# Patient Record
Sex: Female | Born: 1995 | Race: White | Hispanic: No | Marital: Single | Smoking: Never smoker
Health system: Southern US, Community
[De-identification: ages and names within clinical notes are randomized; demographics above are authoritative.]

## PROBLEM LIST (undated history)

## (undated) DIAGNOSIS — J4521 Mild intermittent asthma with (acute) exacerbation: Secondary | ICD-10-CM

## (undated) DIAGNOSIS — R0600 Dyspnea, unspecified: Secondary | ICD-10-CM

## (undated) DIAGNOSIS — R55 Syncope and collapse: Secondary | ICD-10-CM

## (undated) HISTORY — DX: Syncope and collapse: R55

## (undated) HISTORY — DX: Dyspnea, unspecified: R06.00

## (undated) HISTORY — DX: Mild intermittent asthma with (acute) exacerbation: J45.21

---
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
## 2016-01-12 ENCOUNTER — Other Ambulatory Visit: Payer: Self-pay | Admitting: Internal Medicine

## 2016-09-19 IMAGING — DX DG CHEST 2V
2 series · 2 of 2 positions shown · non-contrast
Comparison: None

CLINICAL DATA: Shortness of breath, syncope after running

EXAM:
CHEST  2 VIEW

[chest pa]
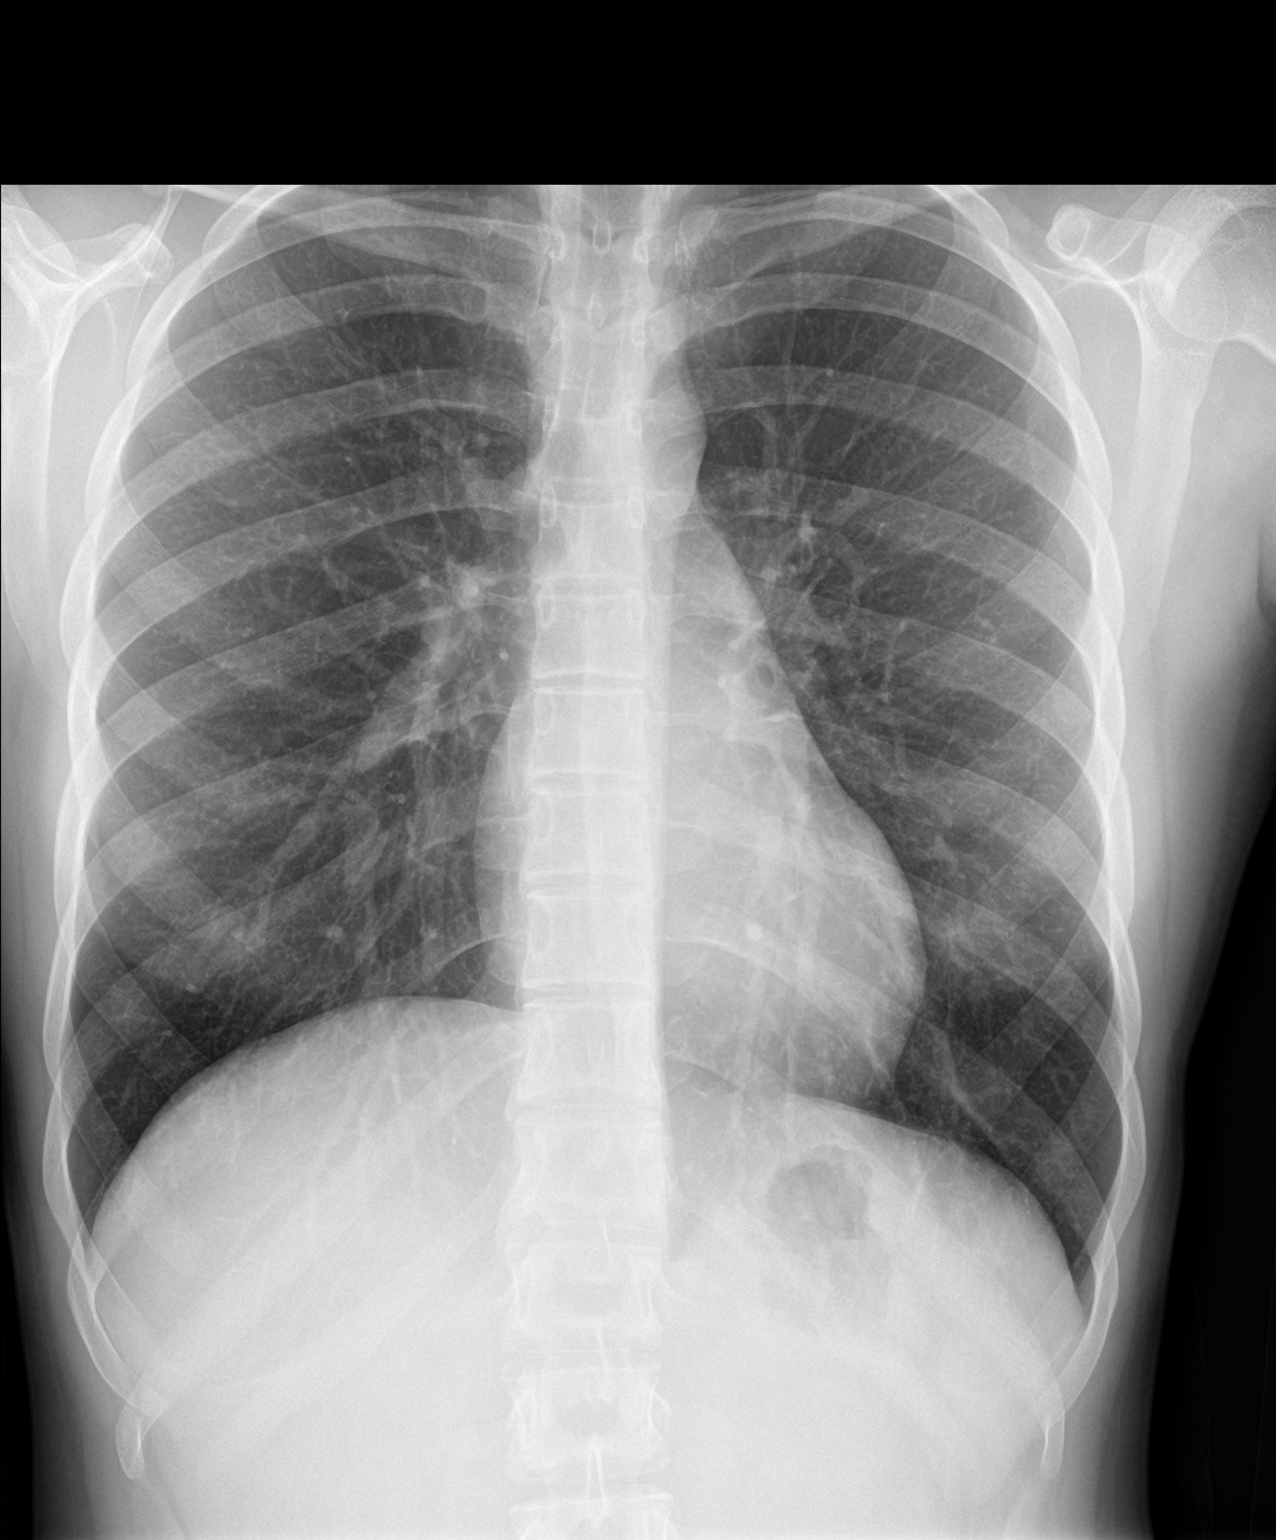

[chest lat]
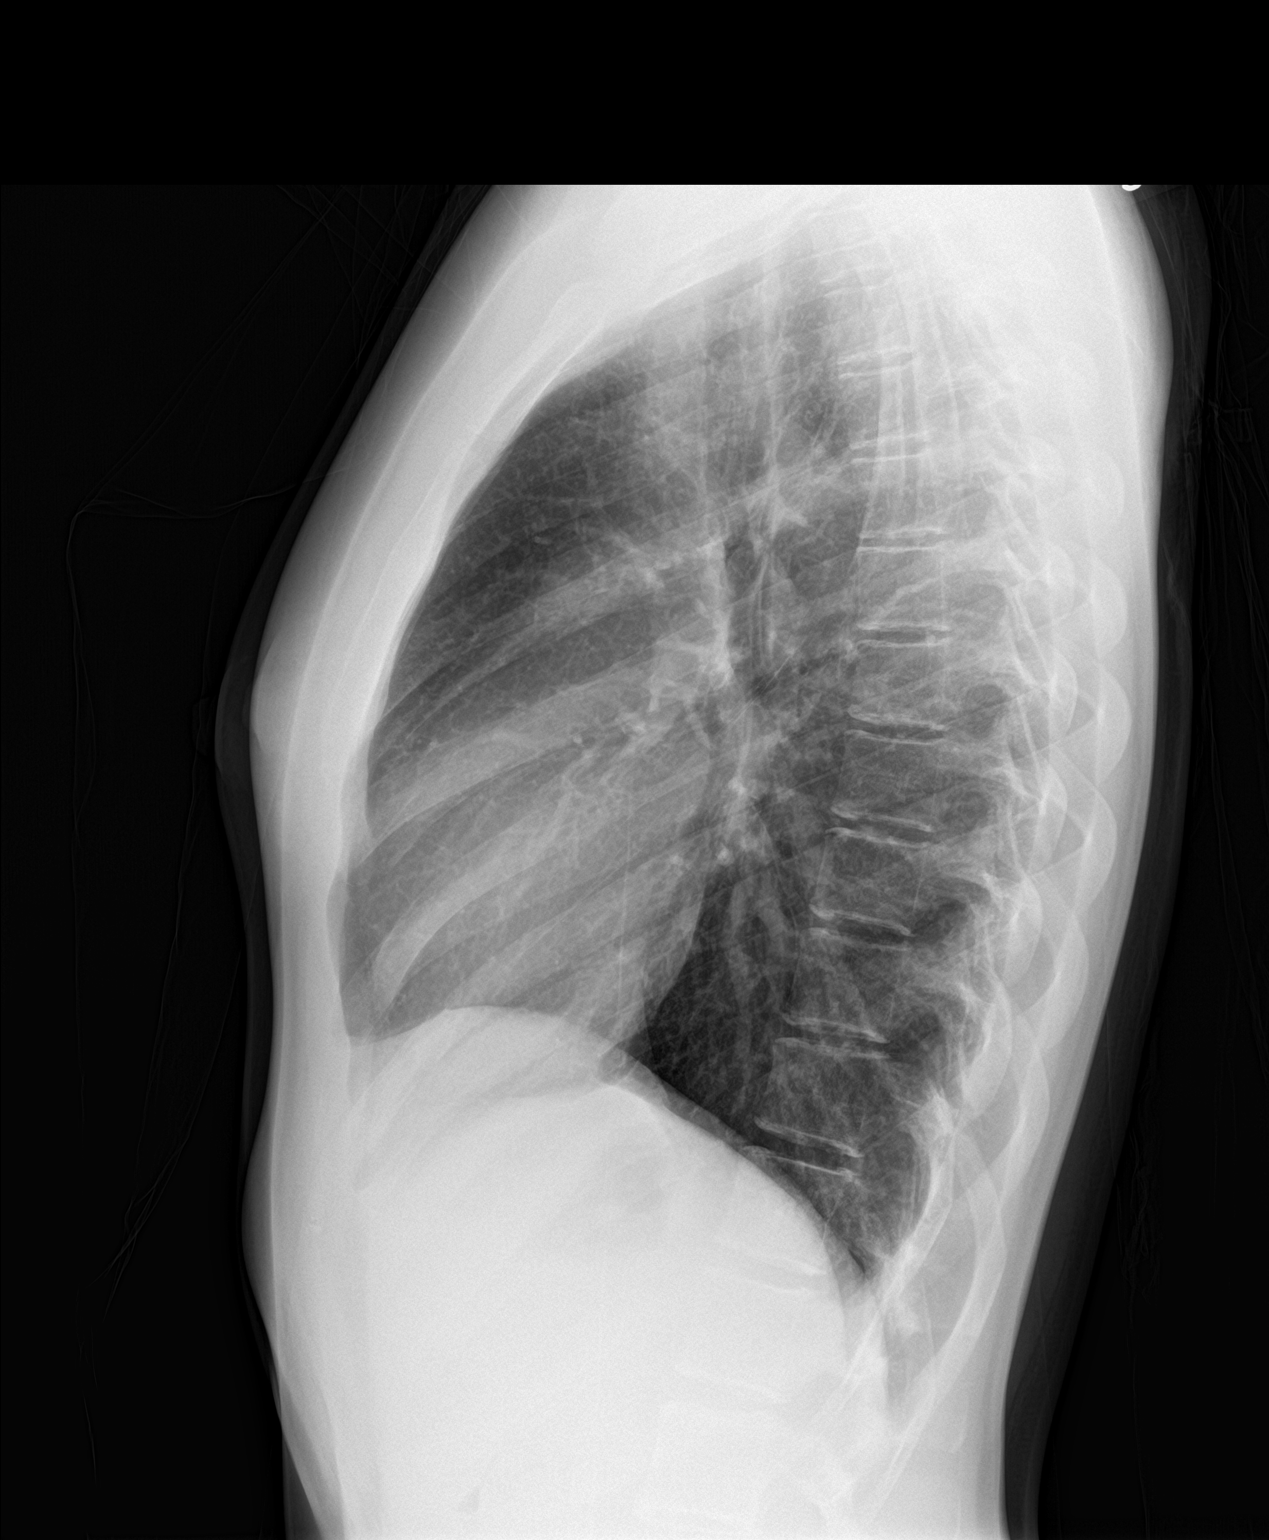

[2 of 2 positions shown; findings below may reference images not displayed]

FINDINGS: Normal heart size, mediastinal contours, and pulmonary vascularity.

Lungs clear.

No pneumothorax.

Bones unremarkable.
IMPRESSION: Normal exam.

## 2016-09-28 ENCOUNTER — Ambulatory Visit (HOSPITAL_COMMUNITY)
Admission: EM | Admit: 2016-09-28 | Discharge: 2016-09-28 | Disposition: A | Discharge status: Home / Self Care | Payer: BLUE CROSS/BLUE SHIELD | Attending: Family Medicine | Admitting: Family Medicine

## 2016-09-28 ENCOUNTER — Encounter (HOSPITAL_COMMUNITY): Payer: Self-pay

## 2016-09-28 DIAGNOSIS — L247 Irritant contact dermatitis due to plants, except food: Secondary | ICD-10-CM

## 2016-09-28 LAB — POCT PREGNANCY, URINE: Preg Test, Ur: NEGATIVE

## 2016-09-28 MED ORDER — PREDNISONE 10 MG (21) PO TBPK: ORAL_TABLET | Freq: Every day | ORAL | 0 refills | Status: DC

## 2016-09-28 NOTE — ED Triage Notes (Signed)
Pt was [place] trail in the woods and had a urgent need to go to the bathroom and said she's sure she wiped her butt with poison ivy. Now has a rash in her buttocks, right side of her abdomen for almost 2 weeks and not getting better.

## 2016-09-28 NOTE — ED Notes (Signed)
Discharged by Dr. Hagler 

## 2016-09-29 NOTE — ED Provider Notes (Signed)
  [facility]   [id number] 09/28/16 Arrival Time: 1707  ASSESSMENT & PLAN:  1. Irritant contact dermatitis due to plants, except food    Meds ordered this encounter  Medications  . predniSONE (STERAPRED UNI-PAK 21 TAB) 10 MG (21) TBPK tablet    Sig: Take by mouth daily. Take as directed.    Dispense:  21 tablet    Refill:  0   UPT negative.  Reviewed expectations re: course of current medical issues. Questions answered. Outlined signs and symptoms indicating need for more acute intervention. Patient verbalized understanding. After Visit Summary given.   SUBJECTIVE:  Michelle Leblanc is a 21 y.o. female who presents with complaint of rash on buttock and LE. Thinks poison ivy. Itching. No drainage. No fever. Present approx 2 weeks. OTC without relief.  Also requests UPT. Patient's last menstrual period was 08/11/2016.  ROS: As per HPI.   OBJECTIVE:  Vitals:   09/28/16 1754  BP: 94/71  Pulse: (!) 44  Resp: 18  Temp: 98.6 F (37 C)  TempSrc: Oral  SpO2: 100%     General appearance: alert; no distress Skin: several areas of rash consistent with rhus on legs and lower trunk No signs of infection.  Results for orders placed or performed during the hospital encounter of 09/28/16  Pregnancy, urine POC  Result Value Ref Range   Preg Test, Ur NEGATIVE NEGATIVE    Labs Reviewed  POCT PREGNANCY, URINE   No Known Allergies  PMHx, SurgHx, SocialHx, Medications, and Allergies were reviewed in the Visit Navigator and updated as appropriate.      Mardella LaymanHagler, Theseus Birnie, MD 09/29/16 [phone]

## 2017-07-03 IMAGING — CR DG HAND COMPLETE 3+V*R*
3 series · 3 of 3 positions shown · non-contrast
Comparison: No priors.

CLINICAL DATA: 20-year-old female with laceration at the base of
the right fifth finger.

EXAM:
RIGHT HAND - COMPLETE 3+ VIEW

[x hand pa right]
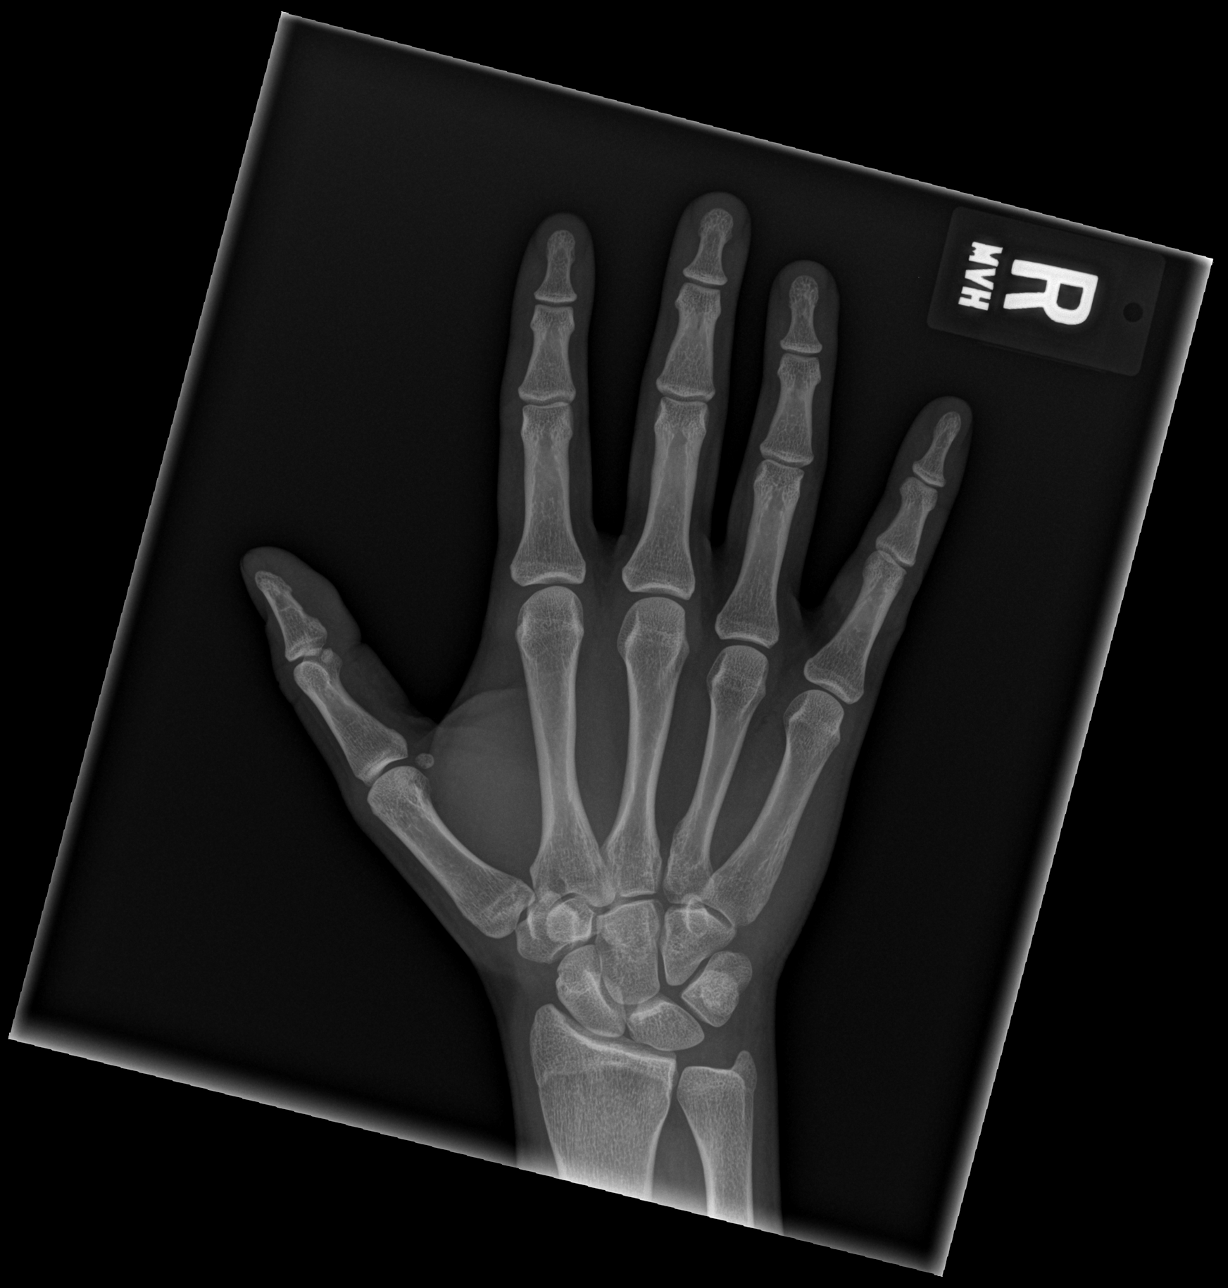

[x hand obl right]
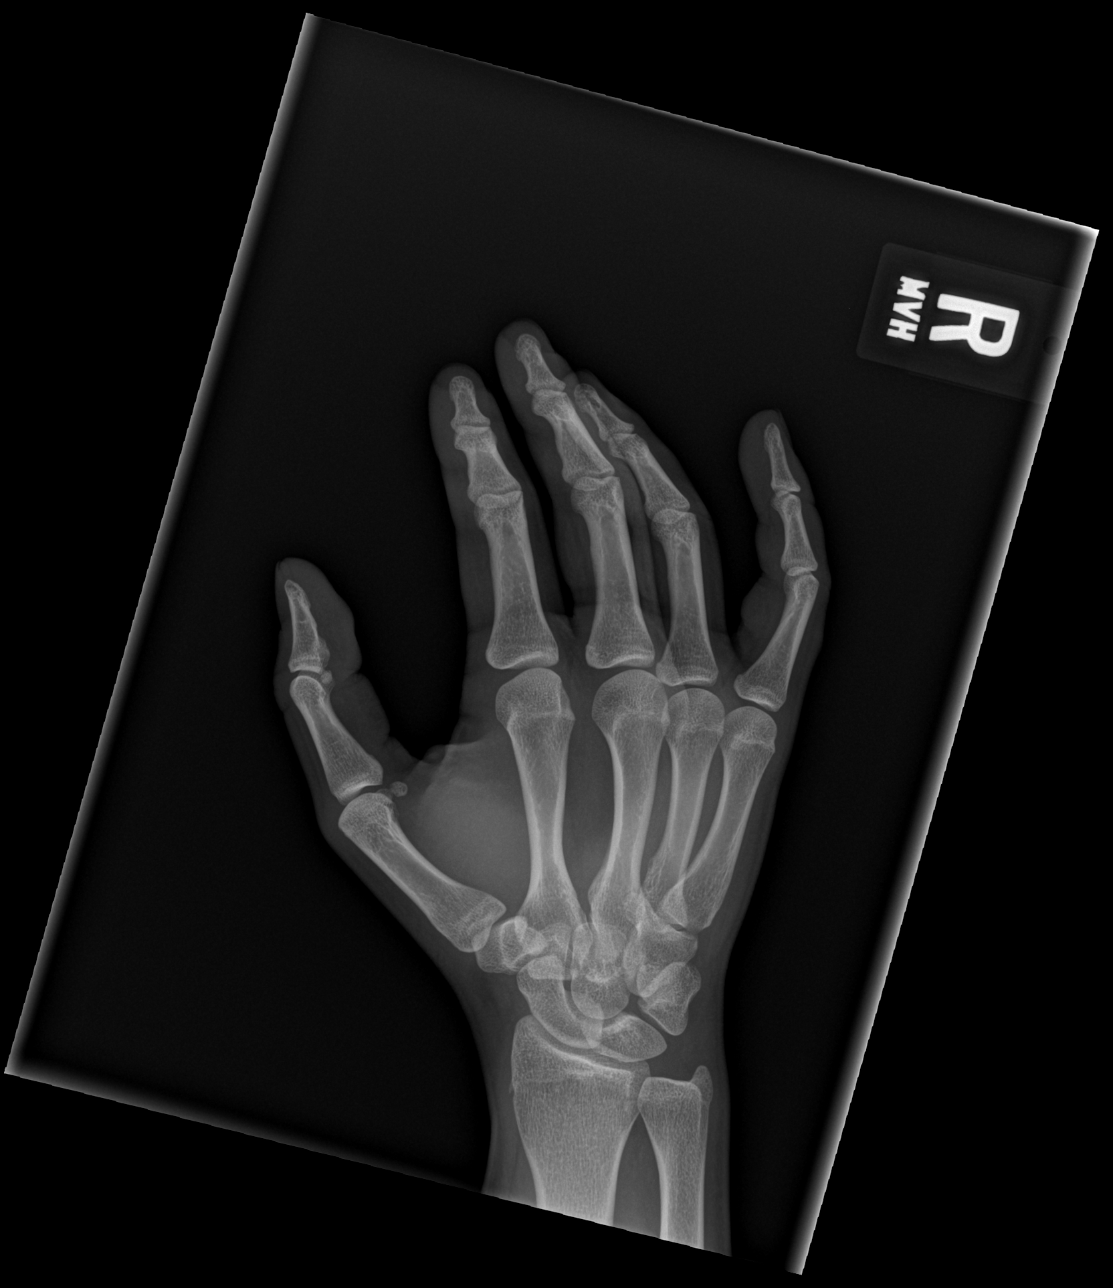

[x hand lat right]
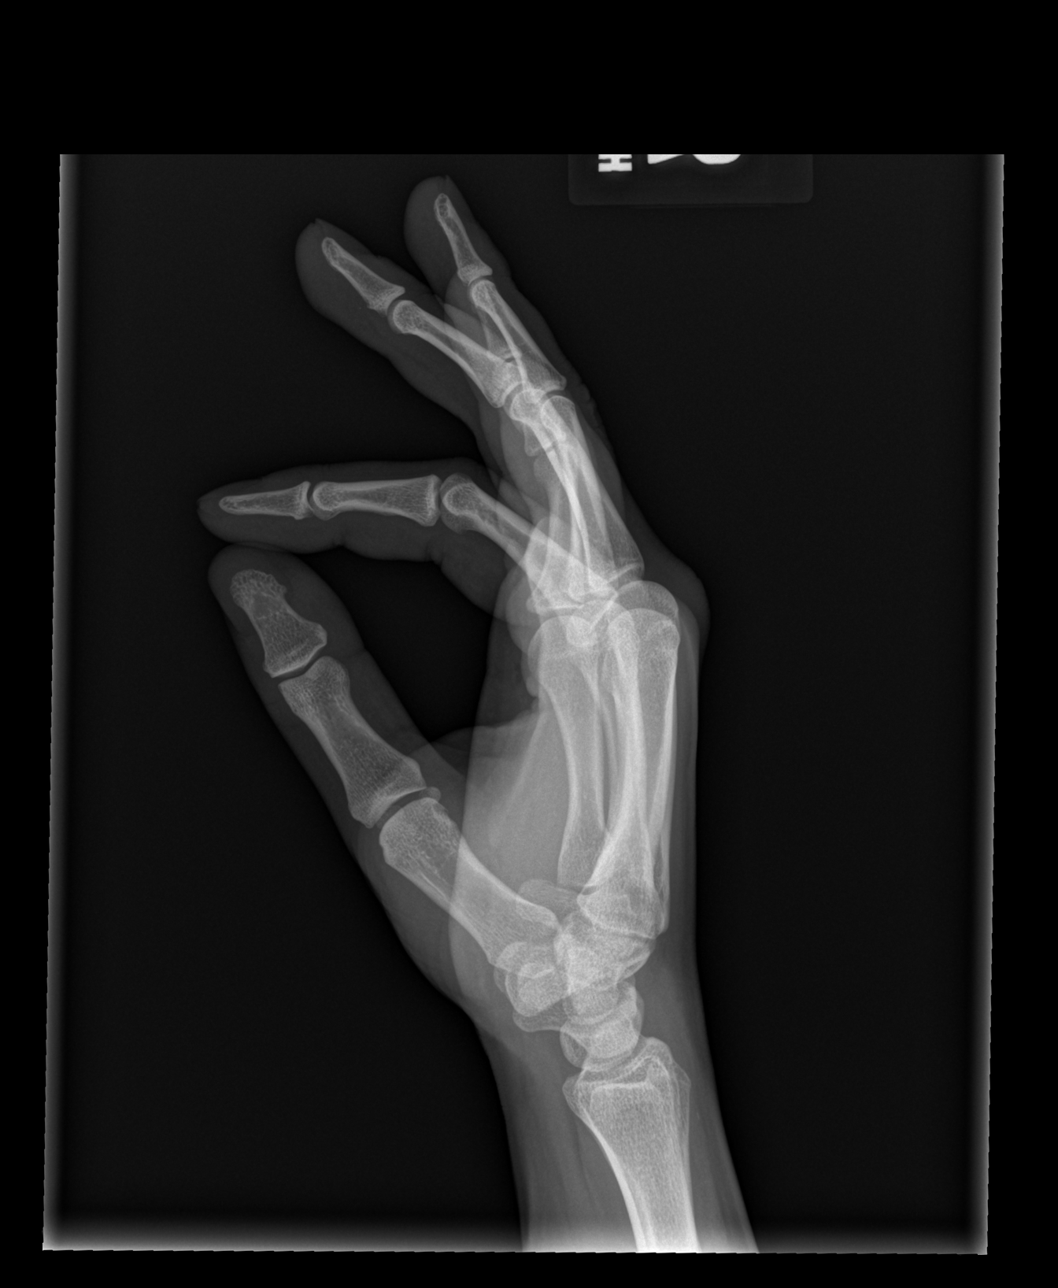

[3 of 3 positions shown; findings below may reference images not displayed]

FINDINGS: Mild soft tissue irregularity is noted overlying the base of the
right fifth finger. No retained radiopaque foreign body. No acute
displaced fracture, subluxation or dislocation.
IMPRESSION: 1. No retained radiopaque foreign body or acute osseous abnormality
in the right hand.

## 2017-12-16 ENCOUNTER — Ambulatory Visit: Payer: Self-pay | Admitting: Family Medicine

## 2017-12-16 VITALS — BP 90/60 | HR 50 | Temp 97.7°F | Resp 16 | Wt 123.0 lb

## 2017-12-16 DIAGNOSIS — L237 Allergic contact dermatitis due to plants, except food: Secondary | ICD-10-CM

## 2017-12-16 MED ORDER — TRIAMCINOLONE ACETONIDE 0.1 % EX CREA: 1.0000 "application " | TOPICAL_CREAM | Freq: Two times a day (BID) | CUTANEOUS | 0 refills | Status: DC

## 2017-12-16 MED ORDER — TRIAMCINOLONE ACETONIDE 0.1 % EX CREA: 1.0000 "application " | TOPICAL_CREAM | Freq: Two times a day (BID) | CUTANEOUS | 0 refills | Status: AC

## 2017-12-16 MED ORDER — PREDNISONE 20 MG PO TABS: ORAL_TABLET | ORAL | 0 refills | Status: DC

## 2017-12-16 MED ORDER — PREDNISONE 20 MG PO TABS: ORAL_TABLET | ORAL | 0 refills | Status: AC

## 2017-12-16 NOTE — Patient Instructions (Signed)
Poison Ivy Dermatitis Poison ivy dermatitis is inflammation of the skin that is caused by the allergens on the leaves of the poison ivy plant. The skin reaction often involves redness, swelling, blisters, and extreme itching. What are the causes? This condition is caused by a specific chemical (urushiol) found in the sap of the poison ivy plant. This chemical is sticky and can be easily spread to people, animals, and objects. You can get poison ivy dermatitis by:  Having direct contact with a poison ivy plant.  Touching animals, other people, or objects that have come in contact with poison ivy and have the chemical on them.  What increases the risk? This condition is more likely to develop in:  People who are outdoors often.  People who go outdoors without wearing protective clothing, such as closed shoes, long pants, and a long-sleeved shirt.  What are the signs or symptoms? Symptoms of this condition include:  Redness and itching.  A rash that often includes bumps and blisters. The rash usually appears 48 hours after exposure.  Swelling. This may occur if the reaction is more severe.  Symptoms usually last for 1-2 weeks. However, the first time you develop this condition, symptoms may last 3-4 weeks. How is this diagnosed? This condition may be diagnosed based on your symptoms and a physical exam. Your health care provider may also ask you about any recent outdoor activity. How is this treated? Treatment for this condition will vary depending on how severe it is. Treatment may include:  Hydrocortisone creams or calamine lotions to relieve itching.  Oatmeal baths to soothe the skin.  Over-the-counter antihistamine tablets.  Oral steroid medicine for more severe outbreaks.  Follow these instructions at home:  Take or apply over-the-counter and prescription medicines only as told by your health care provider.  Wash exposed skin as soon as possible with soap and cold  water.  Use hydrocortisone creams or calamine lotion as needed to soothe the skin and relieve itching.  Take oatmeal baths as needed. Use colloidal oatmeal. You can get this at your local pharmacy or grocery store. Follow the instructions on the packaging.  Do not scratch or rub your skin.  While you have the rash, wash clothes right after you wear them. How is this prevented?  Learn to identify the poison ivy plant and avoid contact with the plant. This plant can be recognized by the number of leaves. Generally, poison ivy has three leaves with flowering branches on a single stem. The leaves are typically glossy, and they have jagged edges that come to a point at the front.  If you have been exposed to poison ivy, thoroughly wash with soap and water right away. You have about 30 minutes to remove the plant resin before it will cause the rash. Be sure to wash under your fingernails because any plant resin there will continue to spread the rash.  When hiking or camping, wear clothes that will help you to avoid exposure on the skin. This includes long pants, a long-sleeved shirt, tall socks, and hiking boots. You can also apply preventive lotion to your skin to help limit exposure.  If you suspect that your clothes or outdoor gear came in contact with poison ivy, rinse them off outside with a garden hose before you bring them inside your house. Contact a health care provider if:  You have open sores in the rash area.  You have more redness, swelling, or pain in the affected area.  You have   redness that spreads beyond the rash area.  You have fluid, blood, or pus coming from the affected area.  You have a fever.  You have a rash over a large area of your body.  You have a rash on your eyes, mouth, or genitals.  Your rash does not improve after a few days. Get help right away if:  Your face swells or your eyes swell shut.  You have trouble breathing.  You have trouble  swallowing. This information is not intended to replace advice given to you by your health care provider. Make sure you discuss any questions you have with your health care provider. Document Released: 02/25/2000 Document Revised: 08/05/2015 Document Reviewed: 08/05/2014 [employer] Interactive Patient Education  2018 [employer]  

## 2017-12-16 NOTE — Progress Notes (Signed)
Michelle Leblanc is a 22 y.o. female who presents today with concerns of poison ivy rash. She has already been treated for this condition by her PCP 12/06/2017 for the same condition. She was provided at steroid taper and experienced minimal improvement she is present today with obvious visual areas of concern. She is otherwise healthy. The patient reports that this condition has the known cause of her using leaves (unsure type) when on long runs to wipe herself with when having to use the bathroom when in the woods or on a long trail. She has since started using individual wipes but is still symptomatic from last incident.  She has lesions on arms, legs, torso, abdomen and buttocks. She is otherwise healthy and without any chronic medical condition or acute issue outside of the cc of rash.   Review of Systems  Constitutional: Negative for chills, fever and malaise/fatigue.  HENT: Negative for congestion, ear discharge, ear pain, sinus pain and sore throat.   Eyes: Negative.   Respiratory: Negative for cough, sputum production and shortness of breath.   Cardiovascular: Negative.  Negative for chest pain.  Gastrointestinal: Negative for abdominal pain, diarrhea, nausea and vomiting.  Genitourinary: Negative for dysuria, frequency, hematuria and urgency.  Musculoskeletal: Negative for myalgias.  Skin: Positive for itching and rash.  Neurological: Negative for headaches.  Endo/Heme/Allergies: Negative.   Psychiatric/Behavioral: Negative.     O: Vitals:   12/16/17 1159  BP: 90/60  Pulse: (!) 47  Resp: 16  Temp: 97.7 F (36.5 C)  SpO2: 98%     Physical Exam  Constitutional: She is oriented to person, place, and time. Vital signs are normal. She appears well-developed and well-nourished. She is active.  Non-toxic appearance. She does not have a sickly appearance.  HENT:  Head: Normocephalic.  Right Ear: Hearing, tympanic membrane, external ear and ear canal normal.  Left Ear: Hearing,  tympanic membrane, external ear and ear canal normal.  Nose: Nose normal.  Mouth/Throat: Uvula is midline and oropharynx is clear and moist.  Neck: Normal range of motion. Neck supple.  Cardiovascular: Normal rate, regular rhythm, normal heart sounds and normal pulses.  Pulmonary/Chest: Effort normal and breath sounds normal.  Abdominal: Soft. Bowel sounds are normal.  Musculoskeletal: Normal range of motion.  Lymphadenopathy:       Head (right side): No submental and no submandibular adenopathy present.       Head (left side): No submental and no submandibular adenopathy present.    She has no cervical adenopathy.  Neurological: She is alert and oriented to person, place, and time.  Skin: Abrasion, ecchymosis and rash noted. Rash is vesicular. There is erythema.     Extensive presence of vesicular like lesions with streaking similar to a plant caused contact dermatitis, also several pinpoint circular lesions consistent with bug bites ( maybe 6-8 of these areas total on anterior and posterior thighs and calfs). Patient has multiple areas of bruising as marked in color other than red on drawing and some lesions are experiencing bruising as they heal which was noted on exam. No areas of open lesions or weeping.  Psychiatric: She has a normal mood and affect.  Vitals reviewed.    A: 1. Poison ivy dermatitis     P: Discussed exam findings, diagnosis etiology and medication use and indications reviewed with patient. Follow- Up and discharge instructions provided. No emergent/urgent issues found on exam.  Patient verbalized understanding of information provided and agrees with plan of care (POC), all questions answered.  1. Poison ivy dermatitis - predniSONE (DELTASONE) 20 MG tablet; Take 3 tablets (60 mg) for 2 days then 2 tablets (40 mg) for 2 days then 1 tablet (20 mg) for 2 days, the 1/2 tablet (10 mg ) for 2 days. - triamcinolone cream (KENALOG) 0.1 %; Apply 1 application topically 2  (two) times daily.   Discussed high dose burst/taper and use of thin layer twice daily of topical steroid. Patient denies pregnancy or historical issue with steroid. 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 Extensive conversation about the medication use, indications and side effects discussed and medication review patient is only currently taking lamictal 200 mg daily and iron. Concern related to recent taper ineffectiveness and the need to retreat with oral steroids as well as taper to decrease the risk of rebound dermatitis. Discussed the need for comprehensive evaluation by primary care to eval brusing ensure no other issue is present. Patient given handout with approx. 12-15 local area primary care providers who are taking patients. Chantilly Diodato verbalized understanding of information provided today.

## 2017-12-18 ENCOUNTER — Telehealth: Payer: Self-pay

## 2017-12-18 NOTE — Telephone Encounter (Signed)
I was no able to contacted the patient and the mail box was full.
